# Patient Record
Sex: Female | Born: 1965 | Race: White | Hispanic: No | Marital: Married
Health system: Southern US, Community
[De-identification: ages and names within clinical notes are randomized; demographics above are authoritative.]

---

## 1999-02-26 ENCOUNTER — Inpatient Hospital Stay (HOSPITAL_COMMUNITY): Admission: AD | Admit: 1999-02-26 | Discharge: 1999-02-26 | Payer: Self-pay | Admitting: Obstetrics and Gynecology

## 1999-02-27 ENCOUNTER — Inpatient Hospital Stay (HOSPITAL_COMMUNITY): Admission: AD | Admit: 1999-02-27 | Discharge: 1999-03-01 | Payer: Self-pay | Admitting: Obstetrics and Gynecology

## 2001-07-21 ENCOUNTER — Other Ambulatory Visit: Admission: RE | Admit: 2001-07-21 | Discharge: 2001-07-21 | Payer: Self-pay | Admitting: Obstetrics and Gynecology

## 2003-01-04 ENCOUNTER — Other Ambulatory Visit: Admission: RE | Admit: 2003-01-04 | Discharge: 2003-01-04 | Payer: Self-pay | Admitting: Obstetrics and Gynecology

## 2004-03-28 ENCOUNTER — Other Ambulatory Visit: Admission: RE | Admit: 2004-03-28 | Discharge: 2004-03-28 | Payer: Self-pay | Admitting: Family Medicine

## 2004-09-01 ENCOUNTER — Encounter: Admission: RE | Admit: 2004-09-01 | Discharge: 2004-09-01 | Payer: Self-pay | Admitting: Obstetrics and Gynecology

## 2008-05-30 ENCOUNTER — Other Ambulatory Visit: Admission: RE | Admit: 2008-05-30 | Discharge: 2008-05-30 | Payer: Self-pay | Admitting: Family Medicine

## 2008-07-11 ENCOUNTER — Encounter: Admission: RE | Admit: 2008-07-11 | Discharge: 2008-07-11 | Payer: Self-pay | Admitting: Family Medicine

## 2011-01-09 ENCOUNTER — Other Ambulatory Visit: Payer: Self-pay | Admitting: Family Medicine

## 2011-01-09 ENCOUNTER — Other Ambulatory Visit (HOSPITAL_COMMUNITY)
Admission: RE | Admit: 2011-01-09 | Discharge: 2011-01-09 | Disposition: A | Discharge status: Home / Self Care | Payer: BC Managed Care – PPO | Source: Ambulatory Visit | Attending: Family Medicine | Admitting: Family Medicine

## 2011-01-09 DIAGNOSIS — Z0000 Encounter for general adult medical examination without abnormal findings: Secondary | ICD-10-CM | POA: Insufficient documentation

## 2012-10-05 ENCOUNTER — Other Ambulatory Visit: Payer: Self-pay | Admitting: Family Medicine

## 2012-10-05 DIAGNOSIS — Z1231 Encounter for screening mammogram for malignant neoplasm of breast: Secondary | ICD-10-CM

## 2012-11-14 ENCOUNTER — Ambulatory Visit
Admission: RE | Admit: 2012-11-14 | Discharge: 2012-11-14 | Disposition: A | Discharge status: Home / Self Care | Payer: BC Managed Care – PPO | Source: Ambulatory Visit | Attending: Family Medicine | Admitting: Family Medicine

## 2012-11-14 DIAGNOSIS — Z1231 Encounter for screening mammogram for malignant neoplasm of breast: Secondary | ICD-10-CM

## 2016-04-02 ENCOUNTER — Other Ambulatory Visit: Payer: Self-pay | Admitting: Gastroenterology

## 2019-01-11 DIAGNOSIS — B001 Herpesviral vesicular dermatitis: Secondary | ICD-10-CM | POA: Diagnosis not present

## 2019-02-07 DIAGNOSIS — F419 Anxiety disorder, unspecified: Secondary | ICD-10-CM | POA: Diagnosis not present

## 2019-02-07 DIAGNOSIS — B009 Herpesviral infection, unspecified: Secondary | ICD-10-CM | POA: Diagnosis not present

## 2019-05-12 DIAGNOSIS — Z20828 Contact with and (suspected) exposure to other viral communicable diseases: Secondary | ICD-10-CM | POA: Diagnosis not present

## 2019-05-13 DIAGNOSIS — Z20828 Contact with and (suspected) exposure to other viral communicable diseases: Secondary | ICD-10-CM | POA: Diagnosis not present

## 2020-04-19 DIAGNOSIS — R1012 Left upper quadrant pain: Secondary | ICD-10-CM | POA: Diagnosis not present

## 2020-04-19 DIAGNOSIS — Z20828 Contact with and (suspected) exposure to other viral communicable diseases: Secondary | ICD-10-CM | POA: Diagnosis not present

## 2020-04-19 DIAGNOSIS — R1011 Right upper quadrant pain: Secondary | ICD-10-CM | POA: Diagnosis not present

## 2020-04-19 DIAGNOSIS — Z03818 Encounter for observation for suspected exposure to other biological agents ruled out: Secondary | ICD-10-CM | POA: Diagnosis not present

## 2020-10-16 ENCOUNTER — Other Ambulatory Visit: Payer: Self-pay | Admitting: Family Medicine

## 2020-10-16 ENCOUNTER — Other Ambulatory Visit (HOSPITAL_COMMUNITY)
Admission: RE | Admit: 2020-10-16 | Discharge: 2020-10-16 | Disposition: A | Discharge status: Home / Self Care | Payer: BC Managed Care – PPO | Source: Ambulatory Visit | Attending: Family Medicine | Admitting: Family Medicine

## 2020-10-16 DIAGNOSIS — Z124 Encounter for screening for malignant neoplasm of cervix: Secondary | ICD-10-CM | POA: Diagnosis not present

## 2020-10-16 DIAGNOSIS — Z0000 Encounter for general adult medical examination without abnormal findings: Secondary | ICD-10-CM | POA: Diagnosis not present

## 2020-10-16 DIAGNOSIS — Z23 Encounter for immunization: Secondary | ICD-10-CM | POA: Diagnosis not present

## 2020-10-16 DIAGNOSIS — Z13220 Encounter for screening for lipoid disorders: Secondary | ICD-10-CM | POA: Diagnosis not present

## 2020-10-18 LAB — CYTOLOGY - PAP
Comment: NEGATIVE
Diagnosis: NEGATIVE
High risk HPV: NEGATIVE

## 2020-10-23 DIAGNOSIS — Z1159 Encounter for screening for other viral diseases: Secondary | ICD-10-CM | POA: Diagnosis not present

## 2020-10-28 DIAGNOSIS — K640 First degree hemorrhoids: Secondary | ICD-10-CM | POA: Diagnosis not present

## 2020-10-28 DIAGNOSIS — Z86010 Personal history of colonic polyps: Secondary | ICD-10-CM | POA: Diagnosis not present

## 2020-10-28 DIAGNOSIS — K5730 Diverticulosis of large intestine without perforation or abscess without bleeding: Secondary | ICD-10-CM | POA: Diagnosis not present

## 2021-07-09 DIAGNOSIS — F419 Anxiety disorder, unspecified: Secondary | ICD-10-CM | POA: Diagnosis not present

## 2021-07-09 DIAGNOSIS — B009 Herpesviral infection, unspecified: Secondary | ICD-10-CM | POA: Diagnosis not present

## 2021-07-09 DIAGNOSIS — R5383 Other fatigue: Secondary | ICD-10-CM | POA: Diagnosis not present

## 2021-07-28 DIAGNOSIS — G4733 Obstructive sleep apnea (adult) (pediatric): Secondary | ICD-10-CM | POA: Diagnosis not present

## 2021-07-28 DIAGNOSIS — F419 Anxiety disorder, unspecified: Secondary | ICD-10-CM | POA: Diagnosis not present

## 2021-08-27 DIAGNOSIS — G4733 Obstructive sleep apnea (adult) (pediatric): Secondary | ICD-10-CM | POA: Diagnosis not present

## 2021-09-26 DIAGNOSIS — G4733 Obstructive sleep apnea (adult) (pediatric): Secondary | ICD-10-CM | POA: Diagnosis not present

## 2021-10-27 DIAGNOSIS — G4733 Obstructive sleep apnea (adult) (pediatric): Secondary | ICD-10-CM | POA: Diagnosis not present

## 2021-11-04 DIAGNOSIS — Z0000 Encounter for general adult medical examination without abnormal findings: Secondary | ICD-10-CM | POA: Diagnosis not present

## 2021-11-04 DIAGNOSIS — G4733 Obstructive sleep apnea (adult) (pediatric): Secondary | ICD-10-CM | POA: Diagnosis not present

## 2021-11-04 DIAGNOSIS — Z13220 Encounter for screening for lipoid disorders: Secondary | ICD-10-CM | POA: Diagnosis not present

## 2021-11-04 DIAGNOSIS — Z23 Encounter for immunization: Secondary | ICD-10-CM | POA: Diagnosis not present

## 2021-11-06 DIAGNOSIS — G4733 Obstructive sleep apnea (adult) (pediatric): Secondary | ICD-10-CM | POA: Diagnosis not present

## 2021-11-10 DIAGNOSIS — L720 Epidermal cyst: Secondary | ICD-10-CM | POA: Diagnosis not present

## 2021-11-10 DIAGNOSIS — D220 Melanocytic nevi of lip: Secondary | ICD-10-CM | POA: Diagnosis not present

## 2021-11-10 DIAGNOSIS — L918 Other hypertrophic disorders of the skin: Secondary | ICD-10-CM | POA: Diagnosis not present

## 2021-11-10 DIAGNOSIS — L821 Other seborrheic keratosis: Secondary | ICD-10-CM | POA: Diagnosis not present

## 2021-11-26 DIAGNOSIS — G4733 Obstructive sleep apnea (adult) (pediatric): Secondary | ICD-10-CM | POA: Diagnosis not present

## 2022-02-23 DIAGNOSIS — G4733 Obstructive sleep apnea (adult) (pediatric): Secondary | ICD-10-CM | POA: Diagnosis not present

## 2022-02-27 ENCOUNTER — Ambulatory Visit (INDEPENDENT_AMBULATORY_CARE_PROVIDER_SITE_OTHER): Payer: BC Managed Care – PPO

## 2022-02-27 ENCOUNTER — Telehealth: Payer: Self-pay | Admitting: Podiatry

## 2022-02-27 ENCOUNTER — Encounter: Payer: Self-pay | Admitting: Podiatry

## 2022-02-27 ENCOUNTER — Ambulatory Visit: Payer: BC Managed Care – PPO | Admitting: Podiatry

## 2022-02-27 DIAGNOSIS — M79671 Pain in right foot: Secondary | ICD-10-CM | POA: Diagnosis not present

## 2022-02-27 DIAGNOSIS — M79672 Pain in left foot: Secondary | ICD-10-CM | POA: Diagnosis not present

## 2022-02-27 DIAGNOSIS — M7662 Achilles tendinitis, left leg: Secondary | ICD-10-CM

## 2022-02-27 MED ORDER — MELOXICAM 15 MG PO TABS: 15.0000 mg | ORAL_TABLET | Freq: Every day | ORAL | 0 refills | Status: DC

## 2022-02-27 NOTE — Telephone Encounter (Signed)
Called patient lvm to advised that appointment on 5/12 needed to be moved to 5/5 at 8:45am ?

## 2022-02-27 NOTE — Patient Instructions (Signed)
Achilles Tendinitis  ?with Rehab ?Achilles tendinitis is a disorder of the Achilles tendon. The Achilles tendon connects the large calf muscles (Gastrocnemius and Soleus) to the heel bone (calcaneus). This tendon is sometimes called the heel cord. It is important for pushing-off and standing on your toes and is important for walking, running, or jumping. Tendinitis is often caused by overuse and repetitive microtrauma. ?SYMPTOMS ?Pain, tenderness, swelling, warmth, and redness may occur over the Achilles tendon even at rest. ?Pain with pushing off, or flexing or extending the ankle. ?Pain that is worsened after or during activity. ?CAUSES  ?Overuse sometimes seen with rapid increase in exercise programs or in sports requiring running and jumping. ?Poor physical conditioning (strength and flexibility or endurance). ?Running sports, especially training running down hills. ?Inadequate warm-up before practice or play or failure to stretch before participation. ?Injury to the tendon. ?PREVENTION  ?Warm up and stretch before practice or competition. ?Allow time for adequate rest and recovery between practices and competition. ?Keep up conditioning. ?Keep up ankle and leg flexibility. ?Improve or keep muscle strength and endurance. ?Improve cardiovascular fitness. ?Use proper technique. ?Use proper equipment (shoes, skates). ?To help prevent recurrence, taping, protective strapping, or an adhesive bandage may be recommended for several weeks after healing is complete. ?PROGNOSIS  ?Recovery may take weeks to several months to heal. ?Longer recovery is expected if symptoms have been prolonged. ?Recovery is usually quicker if the inflammation is due to a direct blow as compared with overuse or sudden strain. ?RELATED COMPLICATIONS  ?Healing time will be prolonged if the condition is not correctly treated. The injury must be given plenty of time to heal. ?Symptoms can reoccur if activity is resumed too soon. ?Untreated,  tendinitis may increase the risk of tendon rupture requiring additional time for recovery and possibly surgery. ?TREATMENT  ?The first treatment consists of rest anti-inflammatory medication, and ice to relieve the pain. ?Stretching and strengthening exercises after resolution of pain will likely help reduce the risk of recurrence. Referral to a physical therapist or athletic trainer for further evaluation and treatment may be helpful. ?A walking boot or cast may be recommended to rest the Achilles tendon. This can help break the cycle of inflammation and microtrauma. ?Arch supports (orthotics) may be prescribed or recommended by your caregiver as an adjunct to therapy and rest. ?Surgery to remove the inflamed tendon lining or degenerated tendon tissue is rarely necessary and has shown less than predictable results. ?MEDICATION  ?Nonsteroidal anti-inflammatory medications, such as aspirin and ibuprofen, may be used for pain and inflammation relief. Do not take within 7 days before surgery. Take these as directed by your caregiver. Contact your caregiver immediately if any bleeding, stomach upset, or signs of allergic reaction occur. Other minor pain relievers, such as acetaminophen, may also be used. ?Pain relievers may be prescribed as necessary by your caregiver. Do not take prescription pain medication for longer than 4 to 7 days. Use only as directed and only as much as you need. ?Cortisone injections are rarely indicated. Cortisone injections may weaken tendons and predispose to rupture. It is better to give the condition more time to heal than to use them. ?HEAT AND COLD ?Cold is used to relieve pain and reduce inflammation for acute and chronic Achilles tendinitis. Cold should be applied for 10 to 15 minutes every 2 to 3 hours for inflammation and pain and immediately after any activity that aggravates your symptoms. Use ice packs or an ice massage. ?Heat may be used before performing stretching   and  strengthening activities prescribed by your caregiver. Use a heat pack or a warm soak. ?SEEK MEDICAL CARE IF: ?Symptoms get worse or do not improve in 2 weeks despite treatment. ?New, unexplained symptoms develop. Drugs used in treatment may produce side effects. ? ?EXERCISES: ? ?RANGE OF MOTION (ROM) AND STRETCHING EXERCISES - Achilles Tendinitis  ?These exercises may help you when beginning to rehabilitate your injury. Your symptoms may resolve with or without further involvement from your physician, physical therapist or athletic trainer. While completing these exercises, remember:  ?Restoring tissue flexibility helps normal motion to return to the joints. This allows healthier, less painful movement and activity. ?An effective stretch should be held for at least 30 seconds. ?A stretch should never be painful. You should only feel a gentle lengthening or release in the stretched tissue. ? ?STRETCH  Gastroc, Standing  ?Place hands on wall. ?Extend right / left leg, keeping the front knee somewhat bent. ?Slightly point your toes inward on your back foot. ?Keeping your right / left heel on the floor and your knee straight, shift your weight toward the wall, not allowing your back to arch. ?You should feel a gentle stretch in the right / left calf. Hold this position for 10 seconds. ?Repeat 3 times. Complete this stretch 2 times per day. ? ?STRETCH  Soleus, Standing  ?Place hands on wall. ?Extend right / left leg, keeping the other knee somewhat bent. ?Slightly point your toes inward on your back foot. ?Keep your right / left heel on the floor, bend your back knee, and slightly shift your weight over the back leg so that you feel a gentle stretch deep in your back calf. ?Hold this position for 10 seconds. ?Repeat 3 times. Complete this stretch 2 times per day. ? ?STRETCH  Gastrocsoleus, Standing  ?Note: This exercise can place a lot of stress on your foot and ankle. Please complete this exercise only if specifically  instructed by your caregiver.  ?Place the ball of your right / left foot on a step, keeping your other foot firmly on the same step. ?Hold on to the wall or a rail for balance. ?Slowly lift your other foot, allowing your body weight to press your heel down over the edge of the step. ?You should feel a stretch in your right / left calf. ?Hold this position for 10 seconds. ?Repeat this exercise with a slight bend in your knee. ?Repeat 3 times. Complete this stretch 2 times per day.  ? ?STRENGTHENING EXERCISES - Achilles Tendinitis ?These exercises may help you when beginning to rehabilitate your injury. They may resolve your symptoms with or without further involvement from your physician, physical therapist or athletic trainer. While completing these exercises, remember:  ?Muscles can gain both the endurance and the strength needed for everyday activities through controlled exercises. ?Complete these exercises as instructed by your physician, physical therapist or athletic trainer. Progress the resistance and repetitions only as guided. ?You may experience muscle soreness or fatigue, but the pain or discomfort you are trying to eliminate should never worsen during these exercises. If this pain does worsen, stop and make certain you are following the directions exactly. If the pain is still present after adjustments, discontinue the exercise until you can discuss the trouble with your clinician. ? ?STRENGTH - Plantar-flexors  ?Sit with your right / left leg extended. Holding onto both ends of a rubber exercise band/tubing, loop it around the ball of your foot. Keep a slight tension in the band. ?Slowly   push your toes away from you, pointing them downward. ?Hold this position for 10 seconds. Return slowly, controlling the tension in the band/tubing. ?Repeat 3 times. Complete this exercise 2 times per day.  ? ?STRENGTH - Plantar-flexors  ?Stand with your feet shoulder width apart. Steady yourself with a wall or table  using as little support as needed. ?Keeping your weight evenly spread over the width of your feet, rise up on your toes.* ?Hold this position for 10 seconds. ?Repeat 3 times. Complete this exercise 2 times per day.  ?

## 2022-02-27 NOTE — Progress Notes (Signed)
?  Subjective:  ?Patient ID: Colleen Erickson, female    DOB: [birth year],   MRN: [id number] ? ?Chief Complaint  ?Patient presents with  ? Foot Pain  ?   ?Heel pain   ? ? ?56 y.o. female presents for concern of bilateral heel pain that has been going on for several months. Relates it is primarily her left heel. Relates constant shooting pain when pressure is applied in the area. Relates pain is mostly at the back of the heel. Has tried advil and rest which has been helpful.  . Denies any other pedal complaints. Denies n/v/f/c.  ? ?History reviewed. No pertinent past medical history. ? ?Objective:  ?Physical Exam: ?Vascular: DP/PT pulses 2/4 bilateral. CFT <3 seconds. Normal hair growth on digits. No edema.  ?Skin. No lacerations or abrasions bilateral feet.  ?Musculoskeletal: MMT 5/5 bilateral lower extremities in DF, PF, Inversion and Eversion. Deceased ROM in DF of ankle joint.  Tender to insertion of achilles tendon No pain proximally along tendon. No pain along PT or with medial calcaneal squeeze.  ?Neurological: Sensation intact to light touch.  ? ?Assessment:  ? ?1. Tendonitis, Achilles, left   ?r ? ? ?Plan:  ?Patient was evaluated and treated and all questions answered. ?-Xrays reviewed No acute fractures or dislocations. Spurring noted to posterior and inferior calcaneus.  ?-Discussed Achilles insertional tendonitis and treatment options with patient.  ?-Discussed stretching exercises. ?-Rx Meloxicam provided  ?-Heel lifts provided and discussed proper shoewear.  ?-Discussed if no improvement will consider MRI/PT/EPAT/PRP injections.  ?-Patient to return to office 6 weeks for re-evaluation.  ? ? ?Louann Sjogren, DPM  ?  ? ?

## 2022-03-26 DIAGNOSIS — G4733 Obstructive sleep apnea (adult) (pediatric): Secondary | ICD-10-CM | POA: Diagnosis not present

## 2022-04-03 ENCOUNTER — Ambulatory Visit: Payer: BC Managed Care – PPO | Admitting: Podiatry

## 2022-04-03 DIAGNOSIS — S0003XA Contusion of scalp, initial encounter: Secondary | ICD-10-CM | POA: Diagnosis not present

## 2022-04-03 DIAGNOSIS — S0990XA Unspecified injury of head, initial encounter: Secondary | ICD-10-CM | POA: Diagnosis not present

## 2022-04-10 ENCOUNTER — Ambulatory Visit: Payer: BC Managed Care – PPO | Admitting: Podiatry

## 2022-04-24 ENCOUNTER — Encounter: Payer: Self-pay | Admitting: Podiatry

## 2022-04-24 ENCOUNTER — Ambulatory Visit: Payer: BC Managed Care – PPO | Admitting: Podiatry

## 2022-04-24 DIAGNOSIS — M7662 Achilles tendinitis, left leg: Secondary | ICD-10-CM | POA: Diagnosis not present

## 2022-04-24 MED ORDER — MELOXICAM 15 MG PO TABS: 15.0000 mg | ORAL_TABLET | Freq: Every day | ORAL | 0 refills | Status: DC

## 2022-04-24 NOTE — Progress Notes (Signed)
  Subjective:  Patient ID: Colleen Erickson, female    DOB: 1966-10-16,   MRN: [id number]  Chief Complaint  Patient presents with   Foot Pain    Achilles - left foot follow up        56 y.o. female presents for follow-up of left achilles tendonitis. Relates she was doing well with melxicam and stretches and shoe gear but then went to the beach and had a fall and pain returned. Relates it is still doing better than it was but was concerned about return of pain . Denies any other pedal complaints. Denies n/v/f/c.   History reviewed. No pertinent past medical history.  Objective:  Physical Exam: Vascular: DP/PT pulses 2/4 bilateral. CFT <3 seconds. Normal hair growth on digits. No edema.  Skin. No lacerations or abrasions bilateral feet.  Musculoskeletal: MMT 5/5 bilateral lower extremities in DF, PF, Inversion and Eversion. Deceased ROM in DF of ankle joint.  Tender to insertion of achilles tendon No pain proximally along tendon. No pain along PT or with medial calcaneal squeeze.  Neurological: Sensation intact to light touch.   Assessment:   1. Tendonitis, Achilles, left      Plan:  Patient was evaluated and treated and all questions answered. -Xrays reviewed No acute fractures or dislocations. Spurring noted to posterior and inferior calcaneus.  -Discussed Achilles insertional tendonitis and treatment options with patient.  -Continue stretching exercises.  -Refill for meloxicam  -Continue heel lifts.  -Discussed if no improvement will consider MRI/PT/EPAT/PRP injections.  -Patient to return to office as needed   Lorenda Peck, DPM

## 2022-04-25 DIAGNOSIS — G4733 Obstructive sleep apnea (adult) (pediatric): Secondary | ICD-10-CM | POA: Diagnosis not present

## 2022-05-05 DIAGNOSIS — F419 Anxiety disorder, unspecified: Secondary | ICD-10-CM | POA: Diagnosis not present

## 2022-05-05 DIAGNOSIS — G4733 Obstructive sleep apnea (adult) (pediatric): Secondary | ICD-10-CM | POA: Diagnosis not present

## 2022-05-05 DIAGNOSIS — B009 Herpesviral infection, unspecified: Secondary | ICD-10-CM | POA: Diagnosis not present

## 2022-05-21 ENCOUNTER — Other Ambulatory Visit: Payer: Self-pay | Admitting: Podiatry

## 2022-05-26 DIAGNOSIS — G4733 Obstructive sleep apnea (adult) (pediatric): Secondary | ICD-10-CM | POA: Diagnosis not present

## 2022-06-25 DIAGNOSIS — G4733 Obstructive sleep apnea (adult) (pediatric): Secondary | ICD-10-CM | POA: Diagnosis not present

## 2022-07-26 DIAGNOSIS — G4733 Obstructive sleep apnea (adult) (pediatric): Secondary | ICD-10-CM | POA: Diagnosis not present

## 2022-08-24 DIAGNOSIS — G4733 Obstructive sleep apnea (adult) (pediatric): Secondary | ICD-10-CM | POA: Diagnosis not present

## 2022-08-26 DIAGNOSIS — G4733 Obstructive sleep apnea (adult) (pediatric): Secondary | ICD-10-CM | POA: Diagnosis not present

## 2022-09-23 DIAGNOSIS — G4733 Obstructive sleep apnea (adult) (pediatric): Secondary | ICD-10-CM | POA: Diagnosis not present

## 2022-10-24 DIAGNOSIS — G4733 Obstructive sleep apnea (adult) (pediatric): Secondary | ICD-10-CM | POA: Diagnosis not present

## 2022-11-10 DIAGNOSIS — Z0000 Encounter for general adult medical examination without abnormal findings: Secondary | ICD-10-CM | POA: Diagnosis not present

## 2022-11-10 DIAGNOSIS — Z23 Encounter for immunization: Secondary | ICD-10-CM | POA: Diagnosis not present

## 2022-11-10 DIAGNOSIS — F419 Anxiety disorder, unspecified: Secondary | ICD-10-CM | POA: Diagnosis not present

## 2022-11-10 DIAGNOSIS — Z13220 Encounter for screening for lipoid disorders: Secondary | ICD-10-CM | POA: Diagnosis not present

## 2022-11-10 DIAGNOSIS — G4733 Obstructive sleep apnea (adult) (pediatric): Secondary | ICD-10-CM | POA: Diagnosis not present

## 2022-12-10 DIAGNOSIS — F419 Anxiety disorder, unspecified: Secondary | ICD-10-CM | POA: Diagnosis not present

## 2022-12-10 DIAGNOSIS — G4733 Obstructive sleep apnea (adult) (pediatric): Secondary | ICD-10-CM | POA: Diagnosis not present

## 2022-12-15 DIAGNOSIS — G4733 Obstructive sleep apnea (adult) (pediatric): Secondary | ICD-10-CM | POA: Diagnosis not present

## 2022-12-30 DIAGNOSIS — Z9189 Other specified personal risk factors, not elsewhere classified: Secondary | ICD-10-CM | POA: Diagnosis not present

## 2022-12-30 DIAGNOSIS — R7303 Prediabetes: Secondary | ICD-10-CM | POA: Diagnosis not present

## 2022-12-30 DIAGNOSIS — E8889 Other specified metabolic disorders: Secondary | ICD-10-CM | POA: Diagnosis not present

## 2022-12-30 DIAGNOSIS — Z1389 Encounter for screening for other disorder: Secondary | ICD-10-CM | POA: Diagnosis not present

## 2022-12-30 DIAGNOSIS — G4733 Obstructive sleep apnea (adult) (pediatric): Secondary | ICD-10-CM | POA: Diagnosis not present

## 2023-01-13 DIAGNOSIS — Z9189 Other specified personal risk factors, not elsewhere classified: Secondary | ICD-10-CM | POA: Diagnosis not present

## 2023-01-13 DIAGNOSIS — G4733 Obstructive sleep apnea (adult) (pediatric): Secondary | ICD-10-CM | POA: Diagnosis not present

## 2023-01-13 DIAGNOSIS — R7303 Prediabetes: Secondary | ICD-10-CM | POA: Diagnosis not present

## 2023-01-15 DIAGNOSIS — G4733 Obstructive sleep apnea (adult) (pediatric): Secondary | ICD-10-CM | POA: Diagnosis not present

## 2023-01-27 DIAGNOSIS — Z9189 Other specified personal risk factors, not elsewhere classified: Secondary | ICD-10-CM | POA: Diagnosis not present

## 2023-01-27 DIAGNOSIS — G4733 Obstructive sleep apnea (adult) (pediatric): Secondary | ICD-10-CM | POA: Diagnosis not present

## 2023-01-27 DIAGNOSIS — R7303 Prediabetes: Secondary | ICD-10-CM | POA: Diagnosis not present

## 2023-02-13 DIAGNOSIS — G4733 Obstructive sleep apnea (adult) (pediatric): Secondary | ICD-10-CM | POA: Diagnosis not present

## 2023-02-17 DIAGNOSIS — G4733 Obstructive sleep apnea (adult) (pediatric): Secondary | ICD-10-CM | POA: Diagnosis not present

## 2023-02-17 DIAGNOSIS — R7303 Prediabetes: Secondary | ICD-10-CM | POA: Diagnosis not present

## 2023-03-11 DIAGNOSIS — G4733 Obstructive sleep apnea (adult) (pediatric): Secondary | ICD-10-CM | POA: Diagnosis not present

## 2023-03-11 DIAGNOSIS — R7303 Prediabetes: Secondary | ICD-10-CM | POA: Diagnosis not present

## 2023-03-30 DIAGNOSIS — G4733 Obstructive sleep apnea (adult) (pediatric): Secondary | ICD-10-CM | POA: Diagnosis not present

## 2023-04-01 DIAGNOSIS — R7303 Prediabetes: Secondary | ICD-10-CM | POA: Diagnosis not present

## 2023-04-01 DIAGNOSIS — G4733 Obstructive sleep apnea (adult) (pediatric): Secondary | ICD-10-CM | POA: Diagnosis not present

## 2023-04-28 DIAGNOSIS — R7303 Prediabetes: Secondary | ICD-10-CM | POA: Diagnosis not present

## 2023-04-28 DIAGNOSIS — G4733 Obstructive sleep apnea (adult) (pediatric): Secondary | ICD-10-CM | POA: Diagnosis not present

## 2023-04-29 DIAGNOSIS — G4733 Obstructive sleep apnea (adult) (pediatric): Secondary | ICD-10-CM | POA: Diagnosis not present

## 2023-05-12 DIAGNOSIS — G4733 Obstructive sleep apnea (adult) (pediatric): Secondary | ICD-10-CM | POA: Diagnosis not present

## 2023-05-12 DIAGNOSIS — R609 Edema, unspecified: Secondary | ICD-10-CM | POA: Diagnosis not present

## 2023-05-12 DIAGNOSIS — F411 Generalized anxiety disorder: Secondary | ICD-10-CM | POA: Diagnosis not present

## 2023-05-26 DIAGNOSIS — R7303 Prediabetes: Secondary | ICD-10-CM | POA: Diagnosis not present

## 2023-05-26 DIAGNOSIS — G4733 Obstructive sleep apnea (adult) (pediatric): Secondary | ICD-10-CM | POA: Diagnosis not present

## 2023-05-30 DIAGNOSIS — G4733 Obstructive sleep apnea (adult) (pediatric): Secondary | ICD-10-CM | POA: Diagnosis not present

## 2023-07-13 DIAGNOSIS — G4733 Obstructive sleep apnea (adult) (pediatric): Secondary | ICD-10-CM | POA: Diagnosis not present

## 2023-08-13 DIAGNOSIS — G4733 Obstructive sleep apnea (adult) (pediatric): Secondary | ICD-10-CM | POA: Diagnosis not present

## 2023-09-12 DIAGNOSIS — G4733 Obstructive sleep apnea (adult) (pediatric): Secondary | ICD-10-CM | POA: Diagnosis not present

## 2023-10-26 DIAGNOSIS — G4733 Obstructive sleep apnea (adult) (pediatric): Secondary | ICD-10-CM | POA: Diagnosis not present

## 2023-11-25 DIAGNOSIS — G4733 Obstructive sleep apnea (adult) (pediatric): Secondary | ICD-10-CM | POA: Diagnosis not present

## 2023-12-06 DIAGNOSIS — D485 Neoplasm of uncertain behavior of skin: Secondary | ICD-10-CM | POA: Diagnosis not present

## 2023-12-06 DIAGNOSIS — L821 Other seborrheic keratosis: Secondary | ICD-10-CM | POA: Diagnosis not present

## 2023-12-15 DIAGNOSIS — G4733 Obstructive sleep apnea (adult) (pediatric): Secondary | ICD-10-CM | POA: Diagnosis not present

## 2023-12-26 DIAGNOSIS — G4733 Obstructive sleep apnea (adult) (pediatric): Secondary | ICD-10-CM | POA: Diagnosis not present

## 2024-01-17 IMAGING — DX DG FOOT COMPLETE 3+V*L*
3 series · 3 of 3 positions shown · non-contrast
Comparison: None.

CLINICAL DATA: Left heel pain.

EXAM:
LEFT FOOT - COMPLETE 3+ VIEW

[foot ap wb]
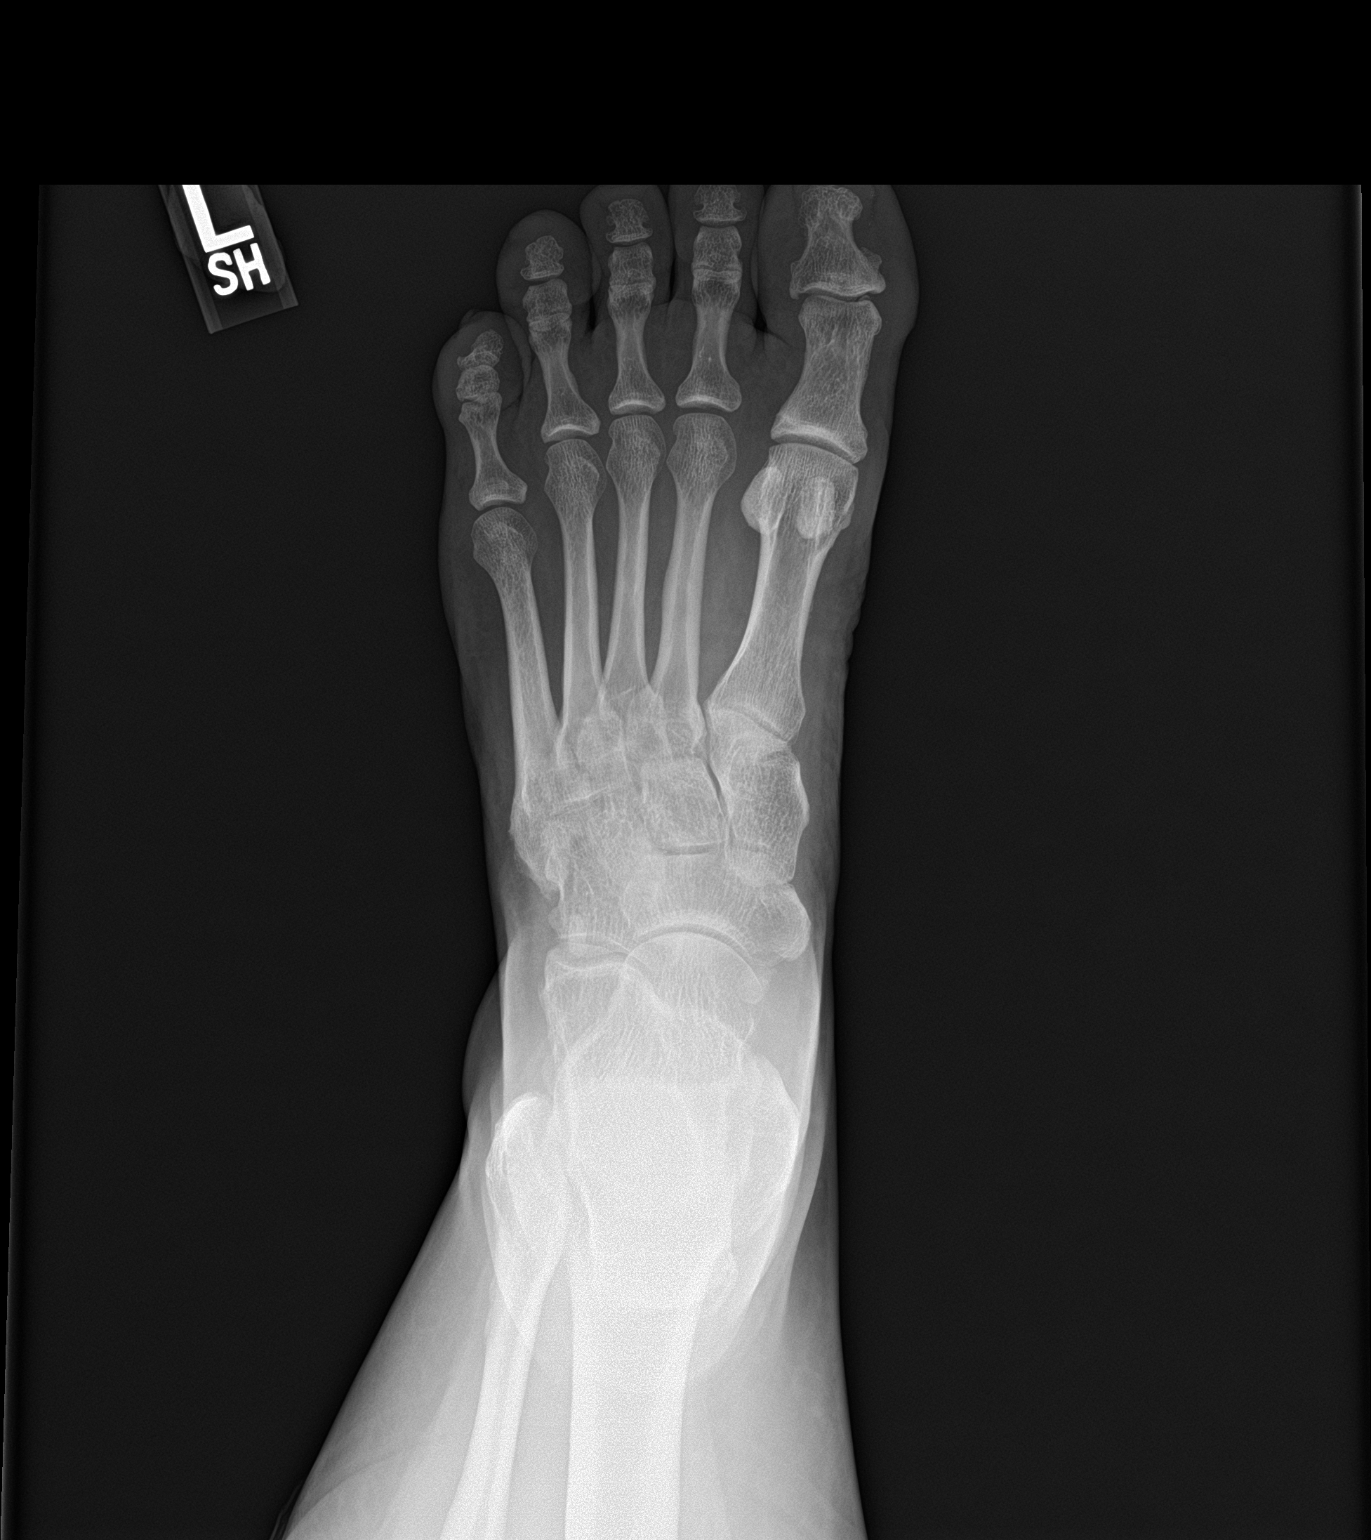

[foot obl wb]
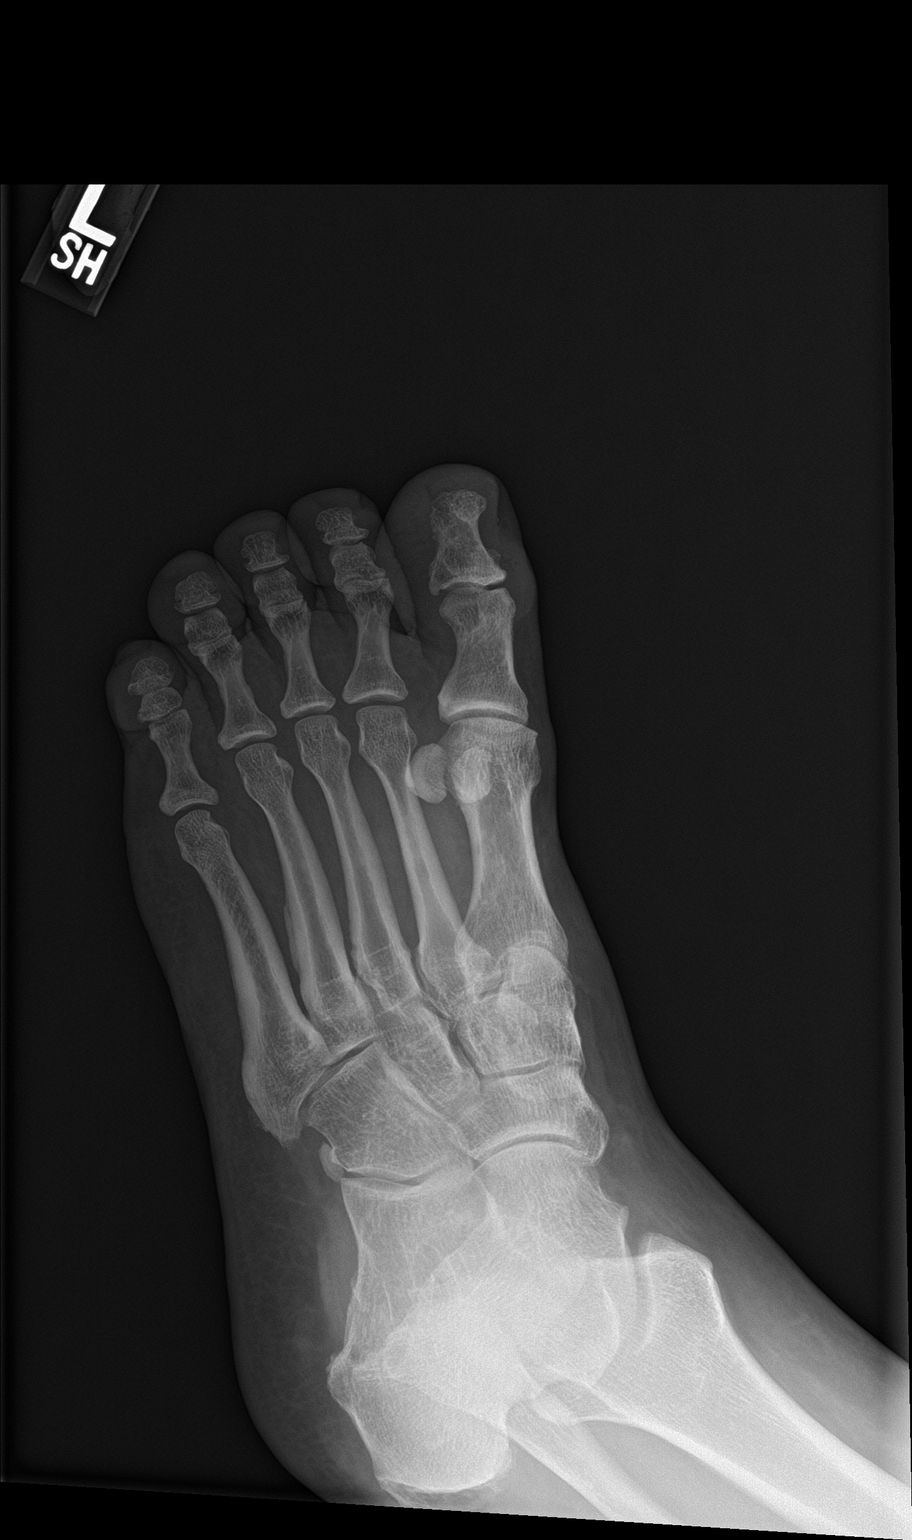

[foot lat wb]
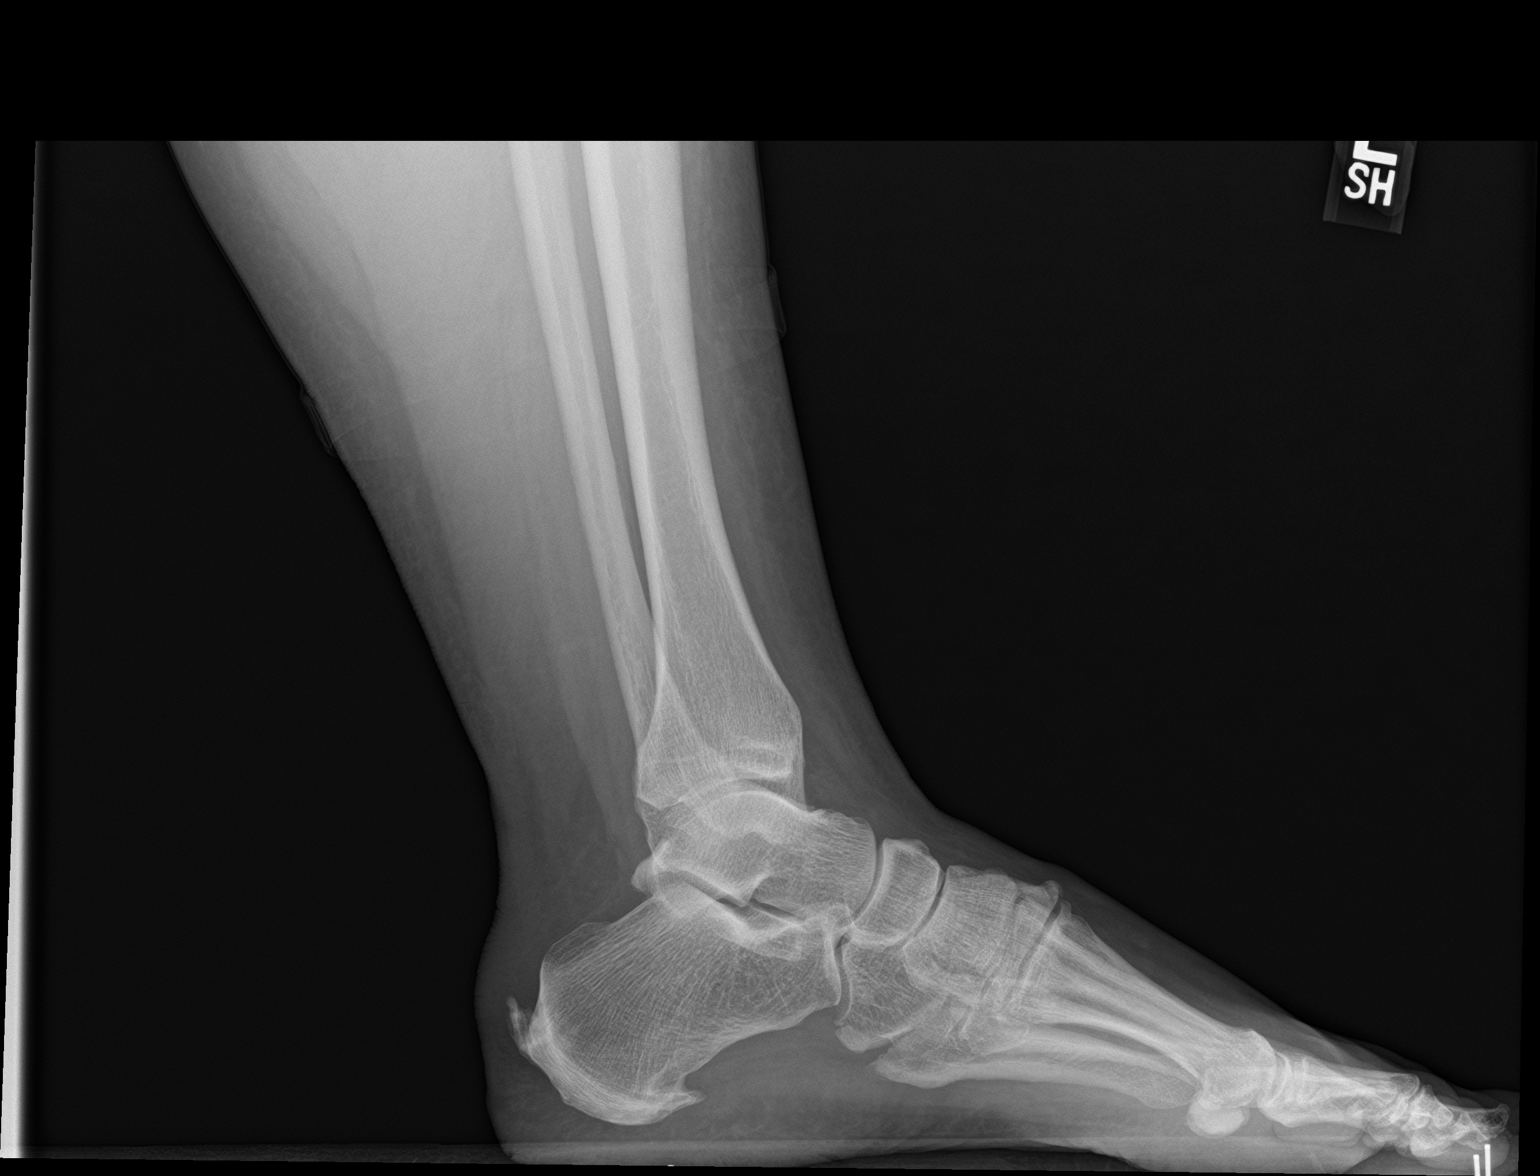

[3 of 3 positions shown; findings below may reference images not displayed]

FINDINGS: Enthesopathic changes are seen at the insertion of the plantar
fascia and Achilles tendon on the calcaneus. No fracture or
dislocation.
IMPRESSION: No acute abnormality of the left foot.

## 2024-01-17 IMAGING — DX DG FOOT COMPLETE 3+V*R*
3 series · 3 of 3 positions shown · non-contrast
Comparison: None.

CLINICAL DATA: Right foot pain.

EXAM:
RIGHT FOOT COMPLETE - 3+ VIEW

[foot ap wb]
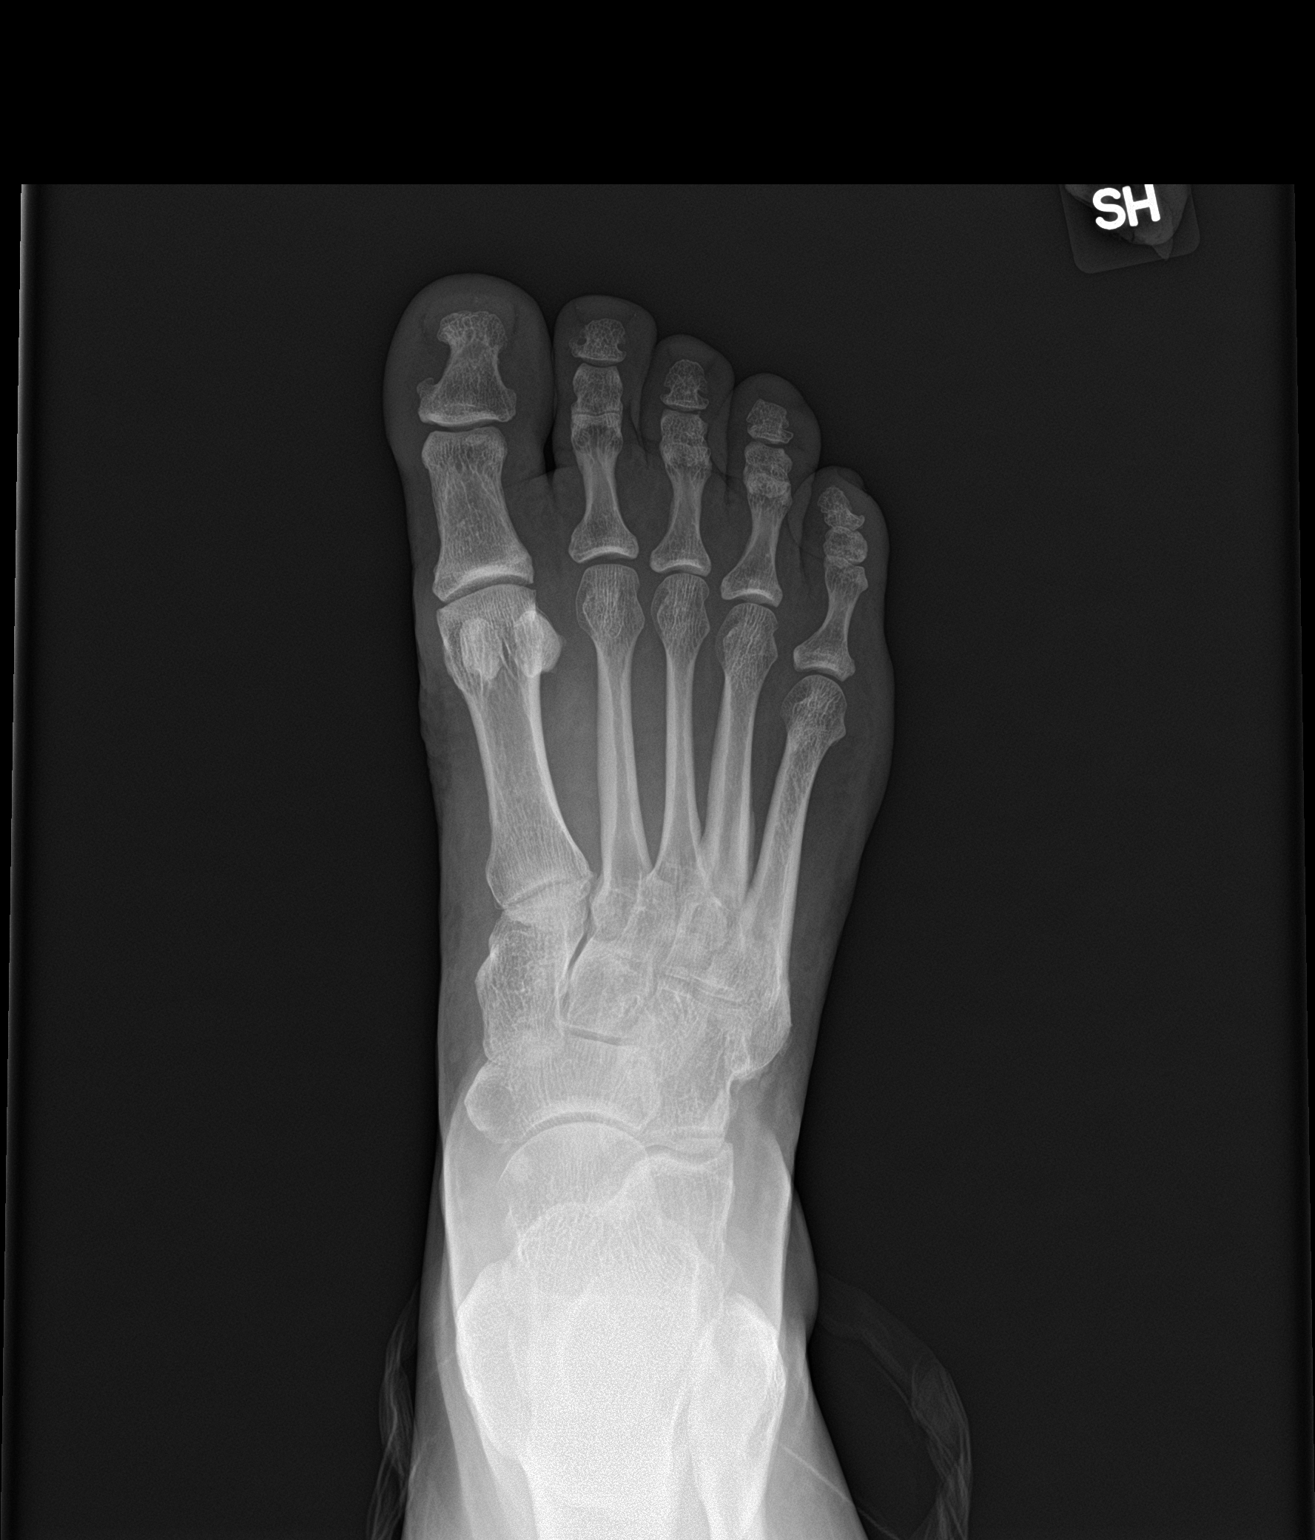

[foot obl wb]
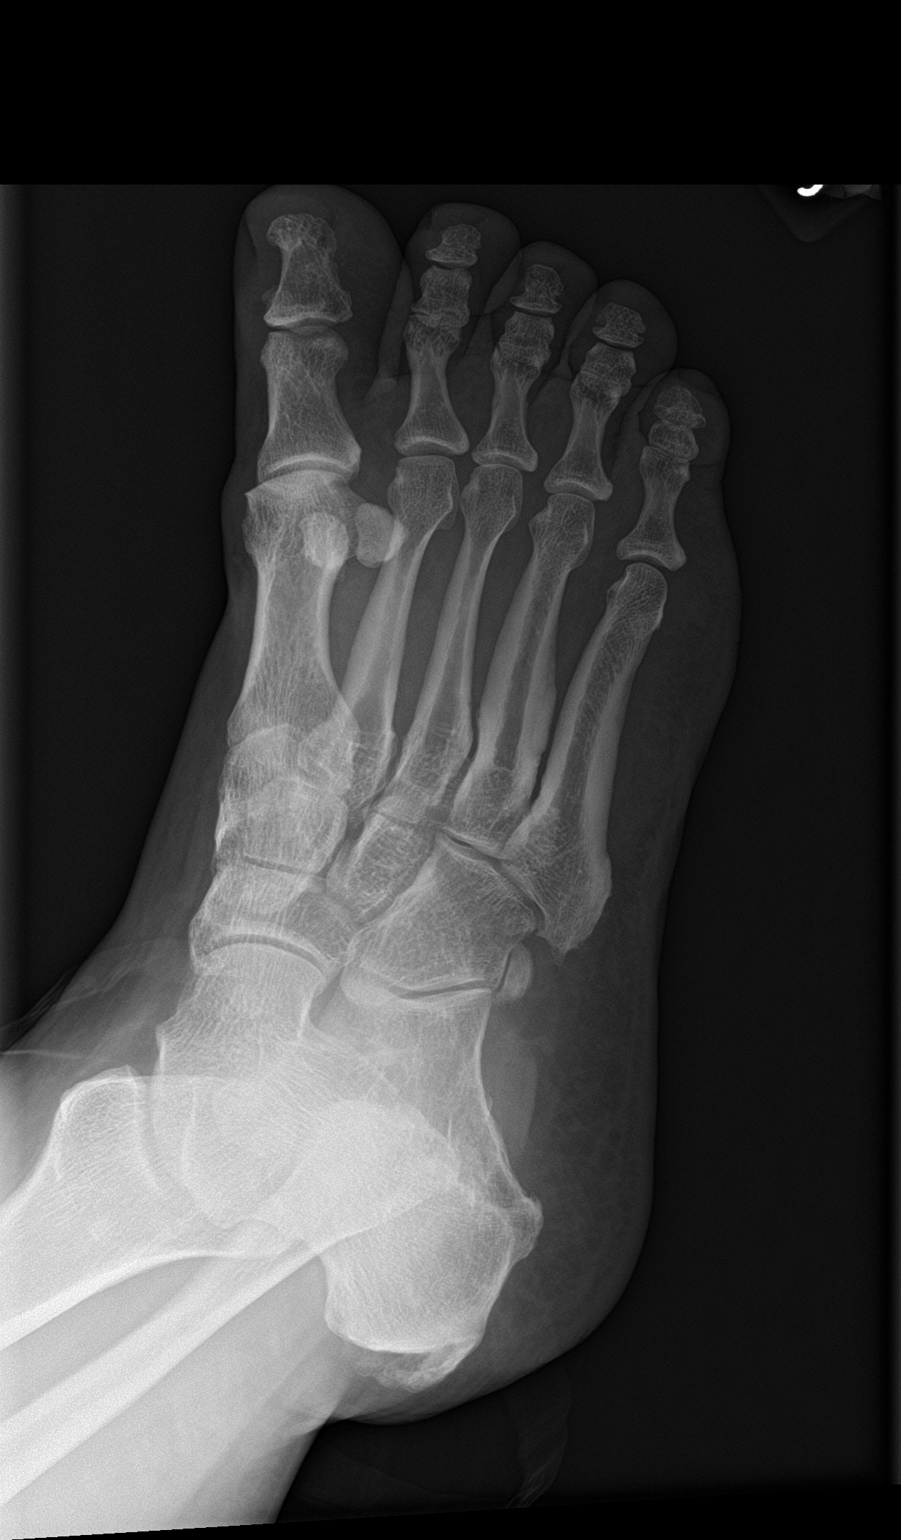

[foot lat wb]
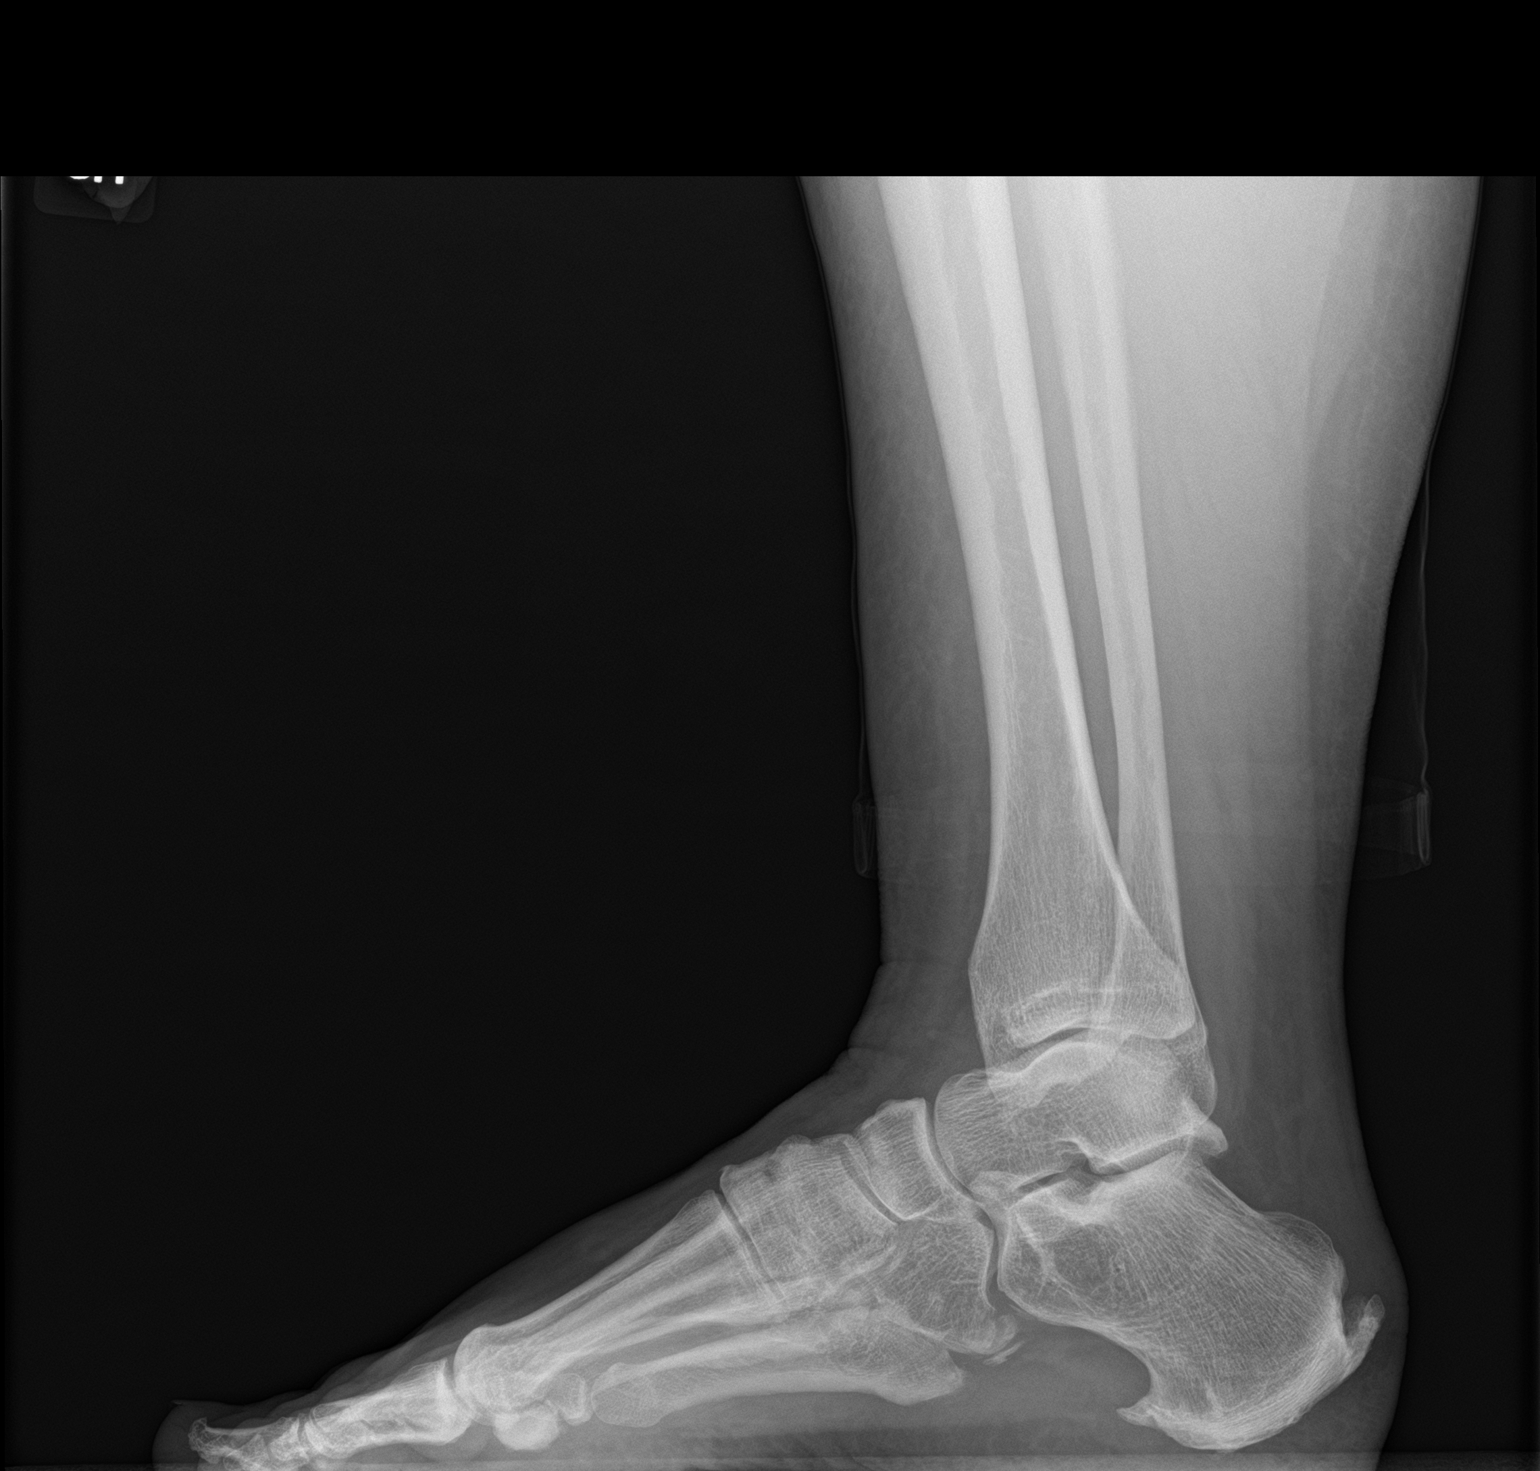

[3 of 3 positions shown; findings below may reference images not displayed]

FINDINGS: No fracture or dislocation. Minimal spurring of the first
metatarsophalangeal joint. Enthesopathic changes are seen at the
insertion of the plantar fascia and Achilles tendon on the
calcaneus.
IMPRESSION: No acute abnormality of the right foot.

## 2024-02-06 DIAGNOSIS — G4733 Obstructive sleep apnea (adult) (pediatric): Secondary | ICD-10-CM | POA: Diagnosis not present

## 2024-03-08 DIAGNOSIS — G4733 Obstructive sleep apnea (adult) (pediatric): Secondary | ICD-10-CM | POA: Diagnosis not present

## 2024-04-07 DIAGNOSIS — G4733 Obstructive sleep apnea (adult) (pediatric): Secondary | ICD-10-CM | POA: Diagnosis not present

## 2024-05-12 DIAGNOSIS — G4733 Obstructive sleep apnea (adult) (pediatric): Secondary | ICD-10-CM | POA: Diagnosis not present

## 2024-06-11 DIAGNOSIS — G4733 Obstructive sleep apnea (adult) (pediatric): Secondary | ICD-10-CM | POA: Diagnosis not present

## 2024-07-12 DIAGNOSIS — G4733 Obstructive sleep apnea (adult) (pediatric): Secondary | ICD-10-CM | POA: Diagnosis not present

## 2024-07-18 DIAGNOSIS — R7303 Prediabetes: Secondary | ICD-10-CM | POA: Diagnosis not present

## 2024-07-18 DIAGNOSIS — Z0000 Encounter for general adult medical examination without abnormal findings: Secondary | ICD-10-CM | POA: Diagnosis not present

## 2024-07-18 DIAGNOSIS — R609 Edema, unspecified: Secondary | ICD-10-CM | POA: Diagnosis not present

## 2024-07-18 DIAGNOSIS — F419 Anxiety disorder, unspecified: Secondary | ICD-10-CM | POA: Diagnosis not present

## 2024-07-18 DIAGNOSIS — G4733 Obstructive sleep apnea (adult) (pediatric): Secondary | ICD-10-CM | POA: Diagnosis not present

## 2024-09-24 DIAGNOSIS — G4733 Obstructive sleep apnea (adult) (pediatric): Secondary | ICD-10-CM | POA: Diagnosis not present

## 2024-10-25 DIAGNOSIS — G4733 Obstructive sleep apnea (adult) (pediatric): Secondary | ICD-10-CM | POA: Diagnosis not present
